# Patient Record
Sex: Male | Born: 2000 | Race: Black or African American | Hispanic: No | Marital: Single
Health system: Southern US, Community
[De-identification: ages and names within clinical notes are randomized; demographics above are authoritative.]

---
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
## 2021-05-29 ENCOUNTER — Emergency Department
Admission: EM | Admit: 2021-05-29 | Discharge: 2021-05-29 | Discharge status: Against Medical Advice | Payer: BC Managed Care – PPO

## 2021-05-29 NOTE — ED Notes (Signed)
No answer when called several times from lobby 

## 2021-05-30 ENCOUNTER — Emergency Department: Payer: Self-pay

## 2021-05-30 ENCOUNTER — Emergency Department
Admission: EM | Admit: 2021-05-30 | Discharge: 2021-05-30 | Disposition: A | Discharge status: Home / Self Care | Payer: Self-pay | Attending: Emergency Medicine | Admitting: Emergency Medicine

## 2021-05-30 ENCOUNTER — Other Ambulatory Visit: Payer: Self-pay

## 2021-05-30 DIAGNOSIS — Y990 Civilian activity done for income or pay: Secondary | ICD-10-CM | POA: Insufficient documentation

## 2021-05-30 DIAGNOSIS — S83422A Sprain of lateral collateral ligament of left knee, initial encounter: Secondary | ICD-10-CM | POA: Insufficient documentation

## 2021-05-30 DIAGNOSIS — M25562 Pain in left knee: Secondary | ICD-10-CM

## 2021-05-30 DIAGNOSIS — S39012A Strain of muscle, fascia and tendon of lower back, initial encounter: Secondary | ICD-10-CM | POA: Insufficient documentation

## 2021-05-30 DIAGNOSIS — W19XXXA Unspecified fall, initial encounter: Secondary | ICD-10-CM

## 2021-05-30 DIAGNOSIS — W01198A Fall on same level from slipping, tripping and stumbling with subsequent striking against other object, initial encounter: Secondary | ICD-10-CM | POA: Insufficient documentation

## 2021-05-30 DIAGNOSIS — Z7722 Contact with and (suspected) exposure to environmental tobacco smoke (acute) (chronic): Secondary | ICD-10-CM | POA: Insufficient documentation

## 2021-05-30 NOTE — Discharge Instructions (Addendum)
Please take Tylenol and ibuprofen/Advil for your pain.  It is safe to take them together, or to alternate them every few hours.  Take up to 1000mg  of Tylenol at a time, up to 4 times per day.  Do not take more than 4000 mg of Tylenol in 24 hours.  For ibuprofen, take 400-600 mg, 4-5 times per day.  Consider picking up an over-the-counter knee brace on the left side, something like a compressive sleeve can be helpful as this heals.  If your symptoms or not any better after about 2 weeks, then reach out to the orthopedic doctor to be seen by them.  It is okay to walk and work with this left leg.  To be careful not to tweak it again.

## 2021-05-30 NOTE — ED Triage Notes (Signed)
Pt to ED for fall yesterday while at work, slipped and fell on milk. C/o left knee pain and lower back pain.  Denies hitting head with fall [employer] comp, [address]

## 2021-05-30 NOTE — ED Provider Notes (Signed)
[facility] Emergency Department Provider Note ____________________________________________   Event Date/Time   First MD Initiated Contact with Patient 05/30/21 [phone]     (approximate)  I have reviewed the triage vital signs and the nursing notes.  HISTORY  Chief Complaint workers compensation and Fall   HPI Ahmet Schank is a 20 y.o. Bonnita Nasuti presents to the ED for evaluation of left knee and lower back pain after a fall.  Chart review indicates no relevant history.  Patient presents to the ED, accompanied by his mother, for evaluation of a workplace injury that occurred yesterday evening around 6 PM.  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  Reports "tweaking" his left knee during this process.  Reports he has been ambulatory since then, but is felt pain to his left lateral knee.  Denies any additional falls or other trauma, denies head trauma, syncope or other injuries.   History reviewed. No pertinent past medical history.  There are no problems to display for this patient.   History reviewed. No pertinent surgical history.  Prior to Admission medications   Medication Sig Start Date End Date Taking? Authorizing Provider  doxycycline (VIBRAMYCIN) 100 MG capsule Take 1 capsule (100 mg total) by mouth 2 (two) times daily. 05/01/18   Tommie Sams, DO    Allergies Patient has no known allergies.  Family History  Problem Relation Age of Onset   Healthy Mother    Healthy Father     Social History Social History   Tobacco Use   Smoking status: Passive Smoke Exposure - Never Smoker   Smokeless tobacco: Never  Substance Use Topics   Alcohol use: No   Drug use: No    Review of Systems  Constitutional: No fever/chills Eyes: No visual changes. ENT: No sore throat. Cardiovascular: Denies chest  pain. Respiratory: Denies shortness of breath. Gastrointestinal: No abdominal pain.  No nausea, no vomiting.  No diarrhea.  No constipation. Genitourinary: Negative for dysuria. Musculoskeletal: Negative for back pain.  Positive for lower back and left knee pain. Skin: Negative for rash. Neurological: Negative for headaches, focal weakness or numbness.  ____________________________________________   PHYSICAL EXAM:  VITAL SIGNS: Vitals:   05/30/21 0755 05/30/21 0959  BP: 132/90 130/88  Pulse: (!) 55 88  Resp: 16 20  Temp: 98.4 F (36.9 C) 98 F (36.7 C)  SpO2: 98% 98%     Constitutional: Alert and oriented. Well appearing and in no acute distress. Eyes: Conjunctivae are normal. PERRL. EOMI. Head: Atraumatic. Nose: No congestion/rhinnorhea. Mouth/Throat: Mucous membranes are moist.  Oropharynx non-erythematous. Neck: No stridor. No cervical spine tenderness to palpation. Cardiovascular: Normal rate, regular rhythm. Grossly normal heart sounds.  Good peripheral circulation. Respiratory: Normal respiratory effort.  No retractions. Lungs CTAB. Gastrointestinal: Soft , nondistended, nontender to palpation. No CVA tenderness. Musculoskeletal: No midline spinal tenderness, paraspinal lumbar tenderness is present without overlying skin changes or signs of trauma.  No bony step-offs. No deformity to the extremities or signs of trauma externally. Does have some tenderness vaguely to the lateral left knee.  Has increased pain with stressing the lateral aspect of his knee.  Lachman negative.  Range of motion intact, with some discomfort laterally. Neurologic:  Normal speech and language. No gross focal neurologic deficits are appreciated. No gait instability noted. Skin:  Skin is warm, dry and intact.  No rash noted. Psychiatric: Mood and affect are normal. Speech and behavior are normal. ____________________________________________   LABS (all labs ordered are listed, but only abnormal  results are displayed)  Labs Reviewed - No data to display ____________________________________________  12 Lead EKG   ____________________________________________  RADIOLOGY  ED MD interpretation: Plain films of the left knee and lumbar spine reviewed by me without evidence of fracture or dislocation  Official radiology report(s): DG Lumbar Spine Complete  Result Date: 05/30/2021 CLINICAL DATA:  Fall, back pain EXAM: LUMBAR SPINE - COMPLETE 4+ VIEW COMPARISON:  None. FINDINGS: Normal alignment of lumbar vertebral bodies. No loss of vertebral body height or disc height. No pars fracture. No subluxation. IMPRESSION: Normal lumbar radiograph Electronically Signed   By: Genevive Bi M.D.   On: 05/30/2021 09:27   DG Knee Complete 4 Views Left  Result Date: 05/30/2021 CLINICAL DATA:  Status post fall.  Left knee pain. EXAM: LEFT KNEE - COMPLETE 4+ VIEW COMPARISON:  None. FINDINGS: No acute fracture or dislocation. No aggressive osseous lesion. Normal alignment. Soft tissue are unremarkable. No radiopaque foreign body or soft tissue emphysema. IMPRESSION: No acute osseous injury of the left knee.   Negative. Electronically Signed   By: Elige Ko M.D.   On: 05/30/2021 08:19    ____________________________________________   PROCEDURES and INTERVENTIONS  Procedure(s) performed (including Critical Care):  Procedures  Medications - No data to display  ____________________________________________   MDM / ED COURSE   Healthy 20 year old male presents to the ED with left knee and lower back pain after a fall, likely soft tissue strains and possibly LCL sprain of the left knee, and amenable to outpatient management.  Normal vitals.  Exam is largely reassuring without evidence of neurologic or vascular deficits.  Has some paraspinal tenderness to his lower back.  Further has some lateral tenderness to his left knee and possibly sprained his LCL.  He is bearing weight and x-rays are  reassuring of his left knee and lower back.  No barriers to outpatient management.  We discussed Tylenol/NSAIDs and possibly following up with Ortho if his knee does not improve.     ____________________________________________   FINAL CLINICAL IMPRESSION(S) / ED DIAGNOSES  Final diagnoses:  Fall, initial encounter  Acute pain of left knee  Strain of lumbar region, initial encounter  Sprain of lateral collateral ligament of left knee, initial encounter     ED Discharge Orders     None        Jamise Pentland   Note:  This document was prepared using [employer] software and may include unintentional dictation errors.    Delton [place], MD 05/30/21 [phone]

## 2021-05-30 NOTE — ED Notes (Signed)
Patients states slipped on spilt milk at work last night around 6:15p Hit left outer knee area and right elbow on tile floor. States rates pain a 5 when standing at knee. Alert and oriented. Mom in room.

## 2022-07-05 IMAGING — CR DG KNEE COMPLETE 4+V*L*
4 series · 4 of 4 positions shown · non-contrast
Comparison: None.

CLINICAL DATA: Status post fall.  Left knee pain.

EXAM:
LEFT KNEE - COMPLETE 4+ VIEW

[knee ap]
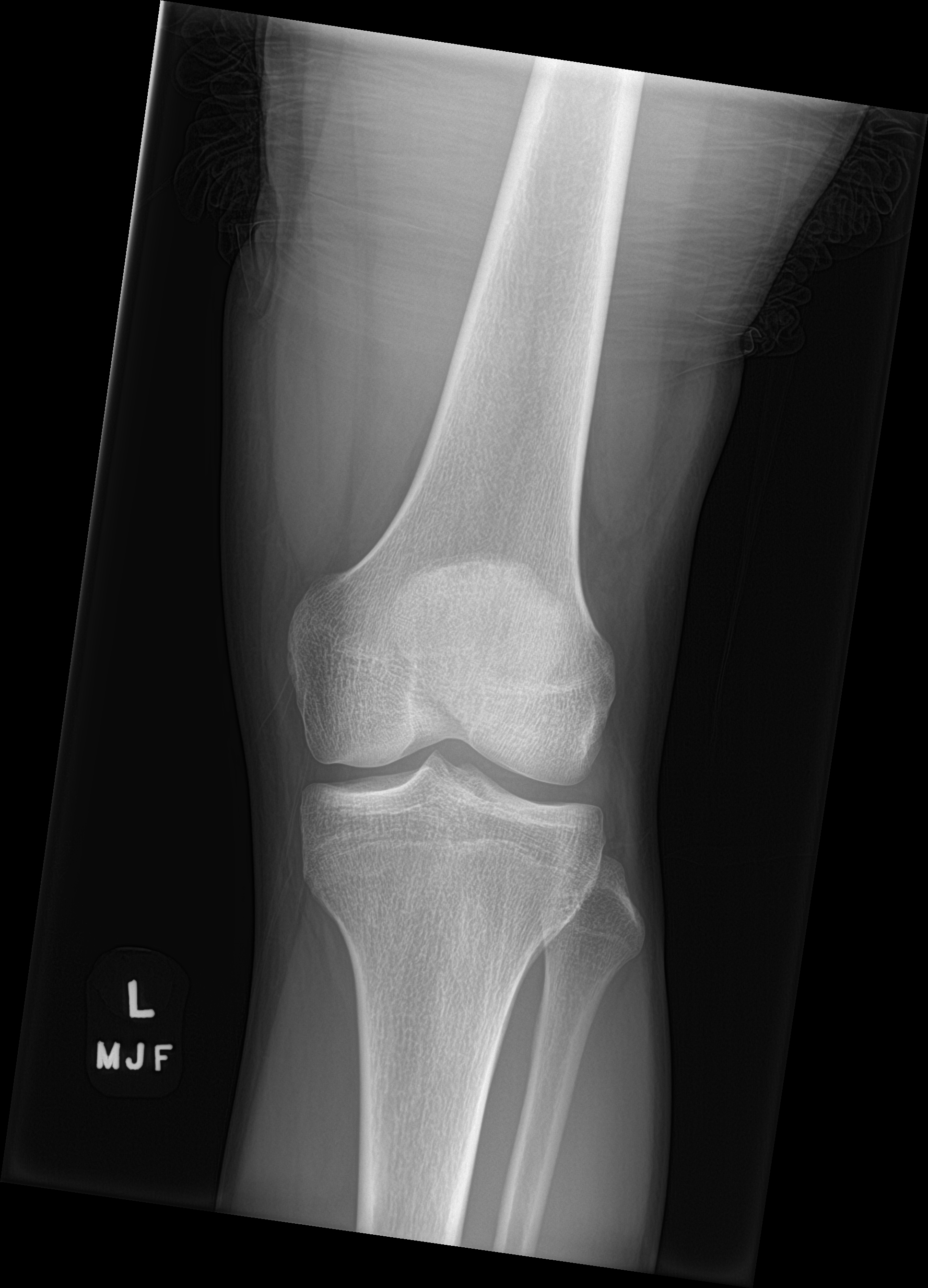

[knee obl (1 of 2)]
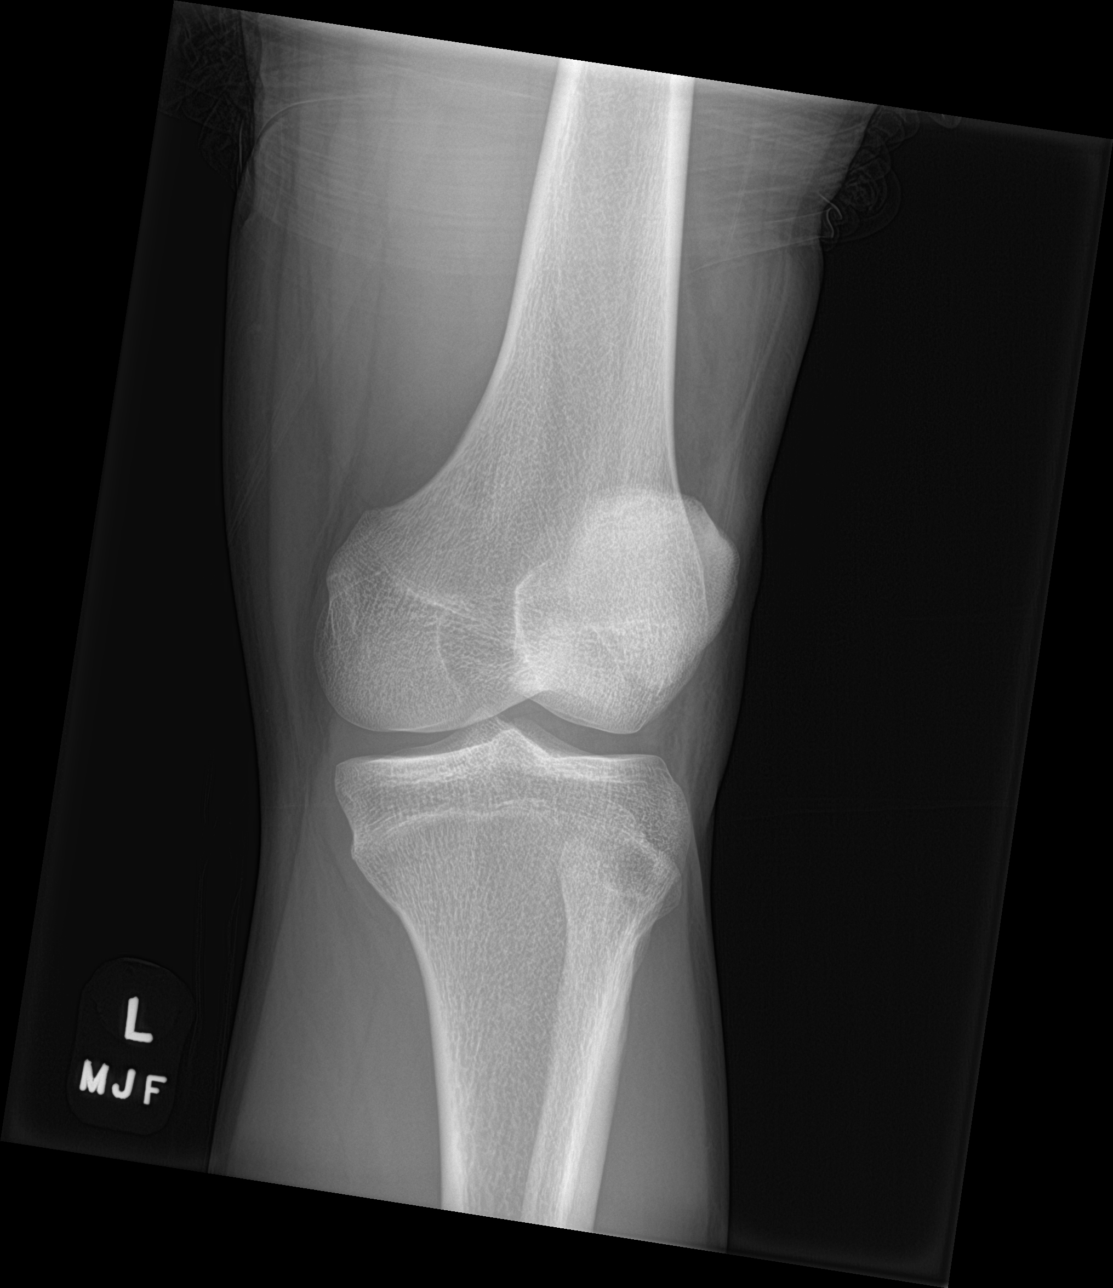

[knee obl (2 of 2)]
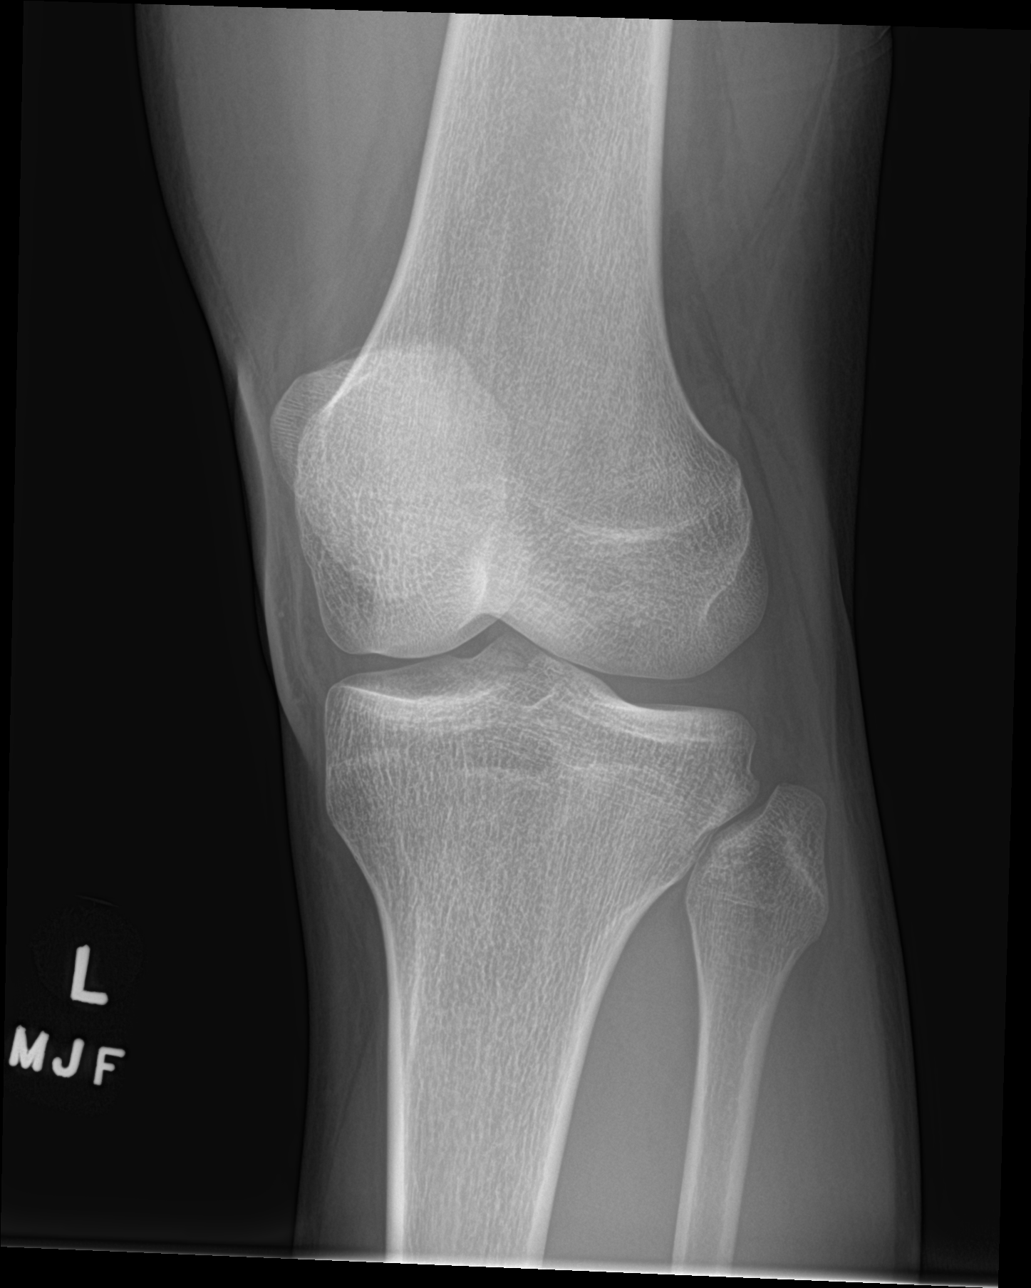

[knee lat]
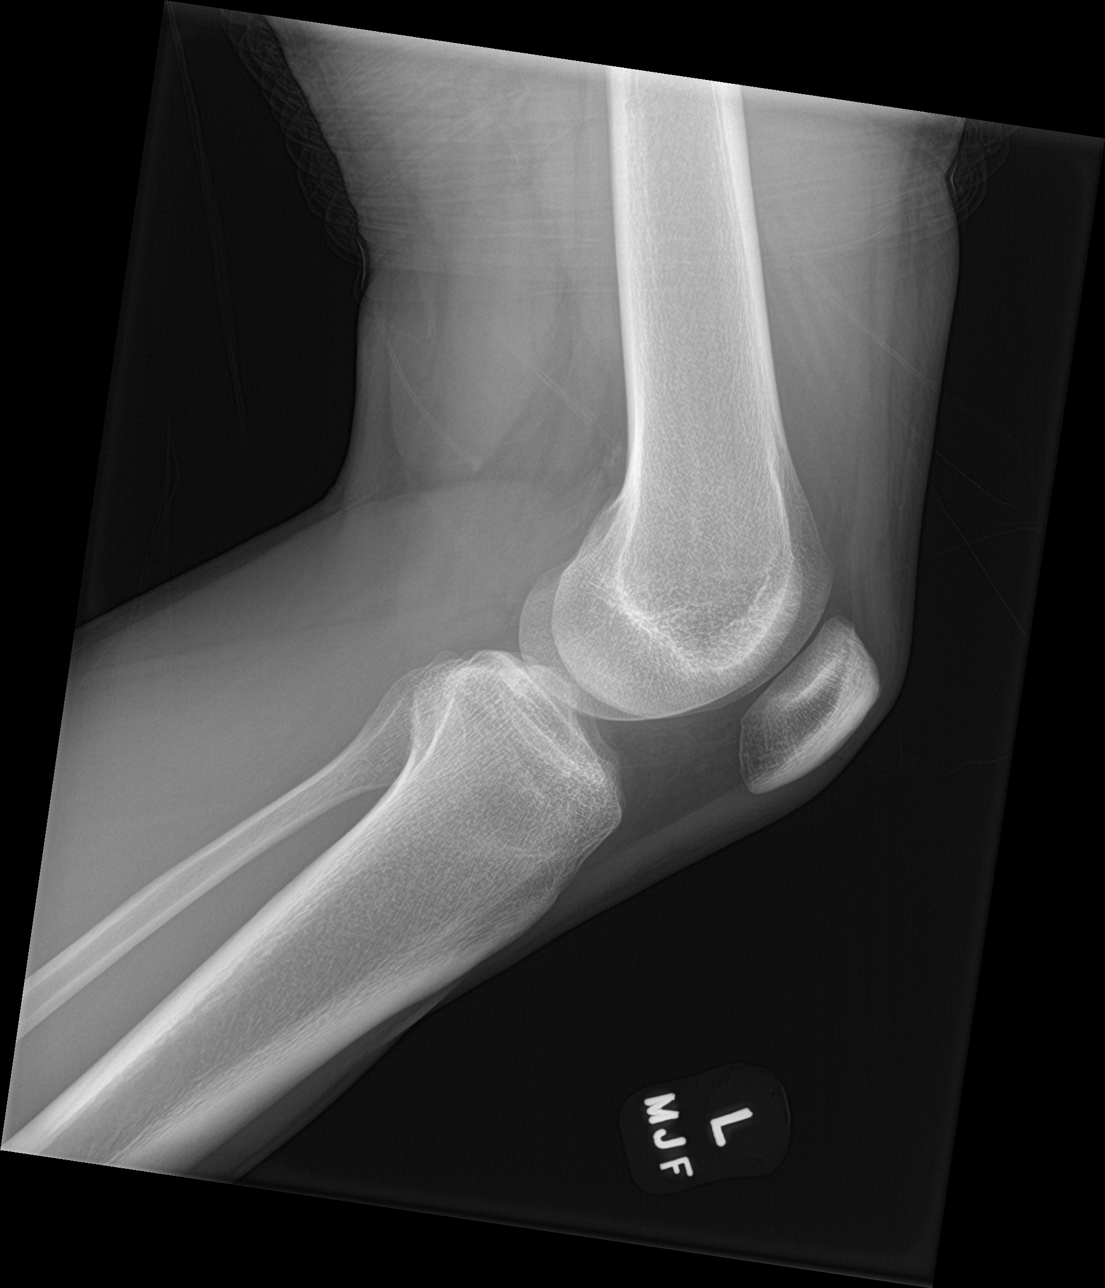

[4 of 4 positions shown; findings below may reference images not displayed]

FINDINGS: No acute fracture or dislocation. No aggressive osseous lesion.
Normal alignment.

Soft tissue are unremarkable. No radiopaque foreign body or soft
tissue emphysema.
IMPRESSION: No acute osseous injury of the left knee.   Negative.
